# Patient Record
Sex: Female | Born: 1985 | Race: White | Hispanic: No | Marital: Single | Smoking: Current some day smoker
Health system: Southern US, Community
[De-identification: ages and names within clinical notes are randomized; demographics above are authoritative.]

## PROBLEM LIST (undated history)

## (undated) HISTORY — PX: TONSILLECTOMY: SUR1361

---

## 2010-09-10 ENCOUNTER — Emergency Department (HOSPITAL_COMMUNITY)
Admission: EM | Admit: 2010-09-10 | Discharge: 2010-09-10 | Disposition: A | Discharge status: Home / Self Care | Payer: Self-pay | Attending: Emergency Medicine | Admitting: Emergency Medicine

## 2010-09-10 DIAGNOSIS — Z0389 Encounter for observation for other suspected diseases and conditions ruled out: Secondary | ICD-10-CM | POA: Insufficient documentation

## 2010-09-10 LAB — URINALYSIS, ROUTINE W REFLEX MICROSCOPIC
Hgb urine dipstick: NEGATIVE
Ketones, ur: NEGATIVE mg/dL
Specific Gravity, Urine: >1.03 — ABNORMAL HIGH (ref 1.005–1.030)
Urobilinogen, UA: 0.2 mg/dL (ref 0.0–1.0)

## 2011-05-10 ENCOUNTER — Encounter (HOSPITAL_COMMUNITY): Payer: Self-pay | Admitting: *Deleted

## 2011-05-10 ENCOUNTER — Emergency Department (HOSPITAL_COMMUNITY)
Admission: EM | Admit: 2011-05-10 | Discharge: 2011-05-10 | Disposition: A | Discharge status: Home Health | Payer: Self-pay | Attending: Emergency Medicine | Admitting: Emergency Medicine

## 2011-05-10 DIAGNOSIS — R197 Diarrhea, unspecified: Secondary | ICD-10-CM | POA: Insufficient documentation

## 2011-05-10 DIAGNOSIS — R112 Nausea with vomiting, unspecified: Secondary | ICD-10-CM | POA: Insufficient documentation

## 2011-05-10 LAB — URINALYSIS, MICROSCOPIC ONLY
Glucose, UA: NEGATIVE mg/dL
Hgb urine dipstick: NEGATIVE
Ketones, ur: 15 mg/dL — AB
Leukocytes, UA: NEGATIVE
pH: 5.5 (ref 5.0–8.0)

## 2011-05-10 MED ORDER — ONDANSETRON HCL 4 MG/2ML IJ SOLN
4.0000 mg | Freq: Once | INTRAMUSCULAR | Status: AC
  Administered 2011-05-10: 4 mg via INTRAVENOUS
  Filled 2011-05-10: qty 2

## 2011-05-10 MED ORDER — PROMETHAZINE HCL 25 MG PO TABS: 25.0000 mg | ORAL_TABLET | Freq: Four times a day (QID) | ORAL | Status: DC | PRN

## 2011-05-10 MED ORDER — ACETAMINOPHEN 325 MG PO TABS
650.0000 mg | ORAL_TABLET | Freq: Once | ORAL | Status: AC
  Administered 2011-05-10: 650 mg via ORAL
  Filled 2011-05-10: qty 2

## 2011-05-10 MED ORDER — SODIUM CHLORIDE 0.9 % IV BOLUS (SEPSIS)
1000.0000 mL | Freq: Once | INTRAVENOUS | Status: AC
  Administered 2011-05-10: 1000 mL via INTRAVENOUS

## 2011-05-10 MED ORDER — ONDANSETRON HCL 4 MG PO TABS
2.0000 mg | ORAL_TABLET | Freq: Once | ORAL | Status: AC
  Administered 2011-05-10: 2 mg via ORAL
  Filled 2011-05-10: qty 1

## 2011-05-10 NOTE — ED Provider Notes (Signed)
History     CSN: [id number]  Arrival date & time 05/10/11  0038   First MD Initiated Contact with Patient 05/10/11 [phone]      Chief Complaint  Patient presents with  . Emesis     The history is provided by the patient.   patient reports developing severe nausea vomiting and diarrhea earlier today.  She denies melena hematochezia.  She denies fevers and chills.  She denies abdominal pain.  She denies chest pain shortness of breath.  She has no cough or congestion.  She's recently started on antibiotics for a left otitis media approximately 1/2 days ago.  She denies dysuria or urinary frequency.  She's had no change in her menstrual cycles.  Nothing worsens her symptoms.  Nothing improves her symptoms.  Her symptoms are constant.  History reviewed. No pertinent past medical history.  Past Surgical History  Procedure Date  . Tonsillectomy     No family history on file.  History  Substance Use Topics  . Smoking status: Current Some Day Smoker  . Smokeless tobacco: Not on file  . Alcohol Use: Yes    OB History    Grav Para Term Preterm Abortions TAB SAB Ect Mult Living                  Review of Systems  Gastrointestinal: Positive for vomiting.  All other systems reviewed and are negative.    Allergies  Penicillins  Home Medications   Current Outpatient Rx  Name Route Sig Dispense Refill  . AMOXICILLIN PO Oral Take by mouth.    Marland Kitchen PERCOCET PO Oral Take by mouth.      BP 134/91  Pulse 105  Temp(Src) 99.5 F (37.5 C) (Oral)  Resp 20  Ht 5\' 6"  (1.676 m)  Wt 200 lb (90.719 kg)  BMI 32.28 kg/m2  SpO2 100%  LMP 03/09/2011  Physical Exam  Nursing note and vitals reviewed. Constitutional: She is oriented to person, place, and time. She appears well-developed and well-nourished. No distress.  HENT:  Head: Normocephalic and atraumatic.       Mucous membranes dry  Eyes: EOM are normal.  Neck: Normal range of motion.  Cardiovascular: Normal rate, regular rhythm  and normal heart sounds.   Pulmonary/Chest: Effort normal and breath sounds normal.  Abdominal: Soft. She exhibits no distension. There is no tenderness.  Musculoskeletal: Normal range of motion.  Neurological: She is alert and oriented to person, place, and time.  Skin: Skin is warm and dry.  Psychiatric: She has a normal mood and affect. Judgment normal.    ED Course  Procedures (including critical care time)  Labs Reviewed  URINALYSIS, WITH MICROSCOPIC - Abnormal; Notable for the following:    Ketones, ur 15 (*)    All other components within normal limits  PREGNANCY, URINE   No results found.   1. Nausea vomiting and diarrhea       MDM  Likely viral gastroenteritis. abd benign on exam. Doubt appendicitis. IVFs given in the ER  4:26 AM The patient is feeling much better at this time.  She is tolerating oral fluids.  Her urinalysis and urine pregnancy are still pending.  We will await these and likely DC home.  I suspect this is a viral gastroenteritis        Lyanne Co, MD 05/10/11 [phone]

## 2011-05-10 NOTE — ED Notes (Signed)
Pt reports n/v/d starting this am

## 2011-05-10 NOTE — ED Notes (Signed)
Patient also complaining of back, legs, and head aching.

## 2013-09-25 ENCOUNTER — Encounter (HOSPITAL_COMMUNITY): Payer: Self-pay | Admitting: Emergency Medicine

## 2013-09-25 ENCOUNTER — Emergency Department (HOSPITAL_COMMUNITY): Payer: BC Managed Care – PPO

## 2013-09-25 ENCOUNTER — Emergency Department (HOSPITAL_COMMUNITY)
Admission: EM | Admit: 2013-09-25 | Discharge: 2013-09-26 | Disposition: A | Discharge status: Home / Self Care | Payer: BC Managed Care – PPO | Attending: Emergency Medicine | Admitting: Emergency Medicine

## 2013-09-25 DIAGNOSIS — Z3202 Encounter for pregnancy test, result negative: Secondary | ICD-10-CM | POA: Insufficient documentation

## 2013-09-25 DIAGNOSIS — T148XXA Other injury of unspecified body region, initial encounter: Secondary | ICD-10-CM

## 2013-09-25 DIAGNOSIS — S99919A Unspecified injury of unspecified ankle, initial encounter: Secondary | ICD-10-CM | POA: Diagnosis not present

## 2013-09-25 DIAGNOSIS — F17200 Nicotine dependence, unspecified, uncomplicated: Secondary | ICD-10-CM | POA: Diagnosis not present

## 2013-09-25 DIAGNOSIS — Y9389 Activity, other specified: Secondary | ICD-10-CM | POA: Insufficient documentation

## 2013-09-25 DIAGNOSIS — Z880 Allergy status to penicillin: Secondary | ICD-10-CM | POA: Insufficient documentation

## 2013-09-25 DIAGNOSIS — S20229A Contusion of unspecified back wall of thorax, initial encounter: Secondary | ICD-10-CM | POA: Diagnosis not present

## 2013-09-25 DIAGNOSIS — Y92410 Unspecified street and highway as the place of occurrence of the external cause: Secondary | ICD-10-CM | POA: Diagnosis not present

## 2013-09-25 DIAGNOSIS — S99929A Unspecified injury of unspecified foot, initial encounter: Secondary | ICD-10-CM

## 2013-09-25 DIAGNOSIS — S8990XA Unspecified injury of unspecified lower leg, initial encounter: Secondary | ICD-10-CM | POA: Insufficient documentation

## 2013-09-25 DIAGNOSIS — IMO0002 Reserved for concepts with insufficient information to code with codable children: Secondary | ICD-10-CM | POA: Diagnosis present

## 2013-09-25 LAB — POC URINE PREG, ED: Preg Test, Ur: NEGATIVE

## 2013-09-25 MED ORDER — ACETAMINOPHEN 325 MG PO TABS
650.0000 mg | ORAL_TABLET | Freq: Once | ORAL | Status: AC
  Administered 2013-09-25: 650 mg via ORAL
  Filled 2013-09-25: qty 2
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
## 2013-09-26 MED ORDER — IBUPROFEN 600 MG PO TABS: 600.0000 mg | ORAL_TABLET | Freq: Four times a day (QID) | ORAL | Status: AC | PRN

## 2013-09-26 MED ORDER — TRAMADOL HCL 50 MG PO TABS
50.0000 mg | ORAL_TABLET | Freq: Once | ORAL | Status: AC
  Administered 2013-09-26: 50 mg via ORAL
  Filled 2013-09-26: qty 1

## 2013-09-26 MED ORDER — TRAMADOL HCL 50 MG PO TABS: 50.0000 mg | ORAL_TABLET | Freq: Four times a day (QID) | ORAL | Status: AC | PRN
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
## 2013-09-26 NOTE — ED Provider Notes (Signed)
Medical screening examination/treatment/procedure(s) were performed by non-physician practitioner and as supervising physician I was immediately available for consultation/collaboration.   Dione Boozeavid Dougles Kimmey, MD 09/26/13 [phone]

## 2013-09-26 NOTE — ED Notes (Signed)
Gave patient ice pack as ordered.
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
